# Patient Record
Sex: Male | Born: 2000 | Race: Black or African American | Hispanic: No | Marital: Single | Smoking: Never smoker
Health system: Southern US, Community
[De-identification: ages and names within clinical notes are randomized; demographics above are authoritative.]

## PROBLEM LIST (undated history)

## (undated) ENCOUNTER — Emergency Department (HOSPITAL_COMMUNITY): Payer: Self-pay | Source: Home / Self Care

## (undated) ENCOUNTER — Emergency Department (HOSPITAL_COMMUNITY): Admission: EM | Payer: Self-pay | Source: Home / Self Care

## (undated) DIAGNOSIS — F909 Attention-deficit hyperactivity disorder, unspecified type: Secondary | ICD-10-CM

---
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
